# Patient Record
Sex: Male | Born: 1990 | Race: White | Hispanic: No | Marital: Single
Health system: Southern US, Community
[De-identification: ages and names within clinical notes are randomized; demographics above are authoritative.]

## PROBLEM LIST (undated history)

## (undated) DIAGNOSIS — R569 Unspecified convulsions: Secondary | ICD-10-CM

## (undated) HISTORY — DX: Unspecified convulsions: R56.9

## (undated) HISTORY — PX: VENTRICULO-PERITONEAL SHUNT PLACEMENT / LAPAROSCOPIC INSERTION PERITONEAL CATHETER: SUR1040

---
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
## 2009-12-14 ENCOUNTER — Emergency Department (HOSPITAL_COMMUNITY): Admission: EM | Admit: 2009-12-14 | Discharge: 2009-12-14 | Payer: Self-pay | Admitting: Emergency Medicine

## 2010-03-05 ENCOUNTER — Emergency Department (HOSPITAL_COMMUNITY): Admission: EM | Admit: 2010-03-05 | Discharge: 2010-03-05 | Payer: Self-pay | Admitting: Emergency Medicine

## 2011-02-20 LAB — HEMOGLOBIN A1C
Hgb A1c MFr Bld: 5.6 % (ref 4.6–6.1)
Mean Plasma Glucose: 114 mg/dL

## 2011-02-20 LAB — CBC
Hemoglobin: 13.8 g/dL (ref 13.0–17.0)
Platelets: 157 10*3/uL (ref 150–400)
RDW: 13 % (ref 11.5–15.5)
WBC: 14.8 10*3/uL — ABNORMAL HIGH (ref 4.0–10.5)

## 2011-02-20 LAB — COMPREHENSIVE METABOLIC PANEL
ALT: 18 U/L (ref 0–53)
AST: 23 U/L (ref 0–37)
BUN: 9 mg/dL (ref 6–23)
Chloride: 101 mEq/L (ref 96–112)
Creatinine, Ser: 0.62 mg/dL (ref 0.4–1.5)
Potassium: 3.8 mEq/L (ref 3.5–5.1)
Sodium: 139 mEq/L (ref 135–145)
Total Bilirubin: 0.2 mg/dL — ABNORMAL LOW (ref 0.3–1.2)

## 2011-02-20 LAB — LIPASE, BLOOD: Lipase: 27 U/L (ref 11–59)

## 2011-02-20 LAB — URINALYSIS, ROUTINE W REFLEX MICROSCOPIC
Nitrite: NEGATIVE
Specific Gravity, Urine: 1.014 (ref 1.005–1.030)

## 2011-02-20 LAB — DIFFERENTIAL
Basophils Absolute: 0.1 10*3/uL (ref 0.0–0.1)
Neutro Abs: 12 10*3/uL — ABNORMAL HIGH (ref 1.7–7.7)
Neutrophils Relative %: 81 % — ABNORMAL HIGH (ref 43–77)

## 2011-09-01 LAB — POCT I-STAT, CHEM 8
BUN: 10
Calcium, Ion: 1.14
Chloride: 103
Creatinine, Ser: 0.8
Glucose, Bld: 113 — ABNORMAL HIGH
HCT: 49
Hemoglobin: 16.7
Potassium: 3.8
Sodium: 140
TCO2: 29

## 2011-09-01 LAB — CBC
RBC: 5.2
WBC: 8.8

## 2011-09-01 LAB — DIFFERENTIAL
Lymphocytes Relative: 25
Monocytes Absolute: 0.6
Monocytes Relative: 7
Neutro Abs: 5.9

## 2011-09-01 LAB — VALPROIC ACID LEVEL: Valproic Acid Lvl: 64.6

## 2013-12-09 ENCOUNTER — Encounter: Payer: Self-pay | Admitting: Family

## 2013-12-09 DIAGNOSIS — R569 Unspecified convulsions: Secondary | ICD-10-CM

## 2013-12-09 DIAGNOSIS — Z87898 Personal history of other specified conditions: Secondary | ICD-10-CM | POA: Insufficient documentation

## 2013-12-09 DIAGNOSIS — F70 Mild intellectual disabilities: Secondary | ICD-10-CM

## 2013-12-09 DIAGNOSIS — G808 Other cerebral palsy: Secondary | ICD-10-CM

## 2013-12-09 DIAGNOSIS — Q039 Congenital hydrocephalus, unspecified: Secondary | ICD-10-CM | POA: Insufficient documentation

## 2013-12-09 DIAGNOSIS — F6381 Intermittent explosive disorder: Secondary | ICD-10-CM | POA: Insufficient documentation

## 2013-12-09 DIAGNOSIS — R51 Headache: Secondary | ICD-10-CM

## 2013-12-09 DIAGNOSIS — R519 Headache, unspecified: Secondary | ICD-10-CM | POA: Insufficient documentation

## 2013-12-12 ENCOUNTER — Ambulatory Visit: Payer: Self-pay | Admitting: Family

## 2013-12-24 ENCOUNTER — Encounter: Payer: Self-pay | Admitting: Family

## 2013-12-24 ENCOUNTER — Ambulatory Visit (INDEPENDENT_AMBULATORY_CARE_PROVIDER_SITE_OTHER): Payer: Medicaid Other | Admitting: Family

## 2013-12-24 VITALS — BP 112/78 | HR 84 | Ht 70.0 in | Wt 199.8 lb

## 2013-12-24 DIAGNOSIS — G808 Other cerebral palsy: Secondary | ICD-10-CM

## 2013-12-24 DIAGNOSIS — Q039 Congenital hydrocephalus, unspecified: Secondary | ICD-10-CM

## 2013-12-24 DIAGNOSIS — F70 Mild intellectual disabilities: Secondary | ICD-10-CM

## 2013-12-24 DIAGNOSIS — R51 Headache: Secondary | ICD-10-CM

## 2013-12-24 DIAGNOSIS — R259 Unspecified abnormal involuntary movements: Secondary | ICD-10-CM

## 2013-12-24 DIAGNOSIS — R569 Unspecified convulsions: Secondary | ICD-10-CM

## 2013-12-24 DIAGNOSIS — R251 Tremor, unspecified: Secondary | ICD-10-CM

## 2013-12-24 DIAGNOSIS — F6381 Intermittent explosive disorder: Secondary | ICD-10-CM
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
## 2013-12-25 ENCOUNTER — Encounter: Payer: Self-pay | Admitting: Family

## 2013-12-25 DIAGNOSIS — R251 Tremor, unspecified: Secondary | ICD-10-CM | POA: Insufficient documentation

## 2013-12-25 NOTE — Patient Instructions (Signed)
Continue Eric Wyatt's medications without change.  Let me know if you have any questions or concerns.  Please plan to follow up in 1 year or sooner if needed.

## 2014-12-24 ENCOUNTER — Encounter: Payer: Self-pay | Admitting: Family

## 2014-12-24 ENCOUNTER — Ambulatory Visit (INDEPENDENT_AMBULATORY_CARE_PROVIDER_SITE_OTHER): Payer: Medicaid Other | Admitting: Family

## 2014-12-24 VITALS — BP 114/72 | HR 80 | Ht 70.0 in | Wt 205.2 lb

## 2014-12-24 DIAGNOSIS — Q039 Congenital hydrocephalus, unspecified: Secondary | ICD-10-CM

## 2014-12-24 DIAGNOSIS — G801 Spastic diplegic cerebral palsy: Secondary | ICD-10-CM

## 2014-12-24 DIAGNOSIS — F70 Mild intellectual disabilities: Secondary | ICD-10-CM

## 2014-12-24 DIAGNOSIS — G808 Other cerebral palsy: Secondary | ICD-10-CM

## 2014-12-24 DIAGNOSIS — F6381 Intermittent explosive disorder: Secondary | ICD-10-CM

## 2014-12-24 DIAGNOSIS — R251 Tremor, unspecified: Secondary | ICD-10-CM
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
## 2014-12-25 NOTE — Patient Instructions (Signed)
Continue Heru's medications as recommended by his psychiatrist. Contact this office if Onalee HuaDavid has breakthrough seizures.   Please plan to return for follow up in 1 year or sooner if needed.

## 2016-01-20 ENCOUNTER — Ambulatory Visit (INDEPENDENT_AMBULATORY_CARE_PROVIDER_SITE_OTHER): Payer: Medicaid Other | Admitting: Family

## 2016-01-20 ENCOUNTER — Encounter: Payer: Self-pay | Admitting: Family

## 2016-01-20 VITALS — BP 110/76 | HR 84 | Ht 70.0 in | Wt 205.0 lb

## 2016-01-20 DIAGNOSIS — Z8669 Personal history of other diseases of the nervous system and sense organs: Secondary | ICD-10-CM

## 2016-01-20 DIAGNOSIS — G808 Other cerebral palsy: Secondary | ICD-10-CM

## 2016-01-20 DIAGNOSIS — F6381 Intermittent explosive disorder: Secondary | ICD-10-CM | POA: Diagnosis not present

## 2016-01-20 DIAGNOSIS — F70 Mild intellectual disabilities: Secondary | ICD-10-CM | POA: Diagnosis not present

## 2016-01-20 DIAGNOSIS — Z87898 Personal history of other specified conditions: Secondary | ICD-10-CM

## 2016-01-20 DIAGNOSIS — Q039 Congenital hydrocephalus, unspecified: Secondary | ICD-10-CM | POA: Diagnosis not present

## 2016-01-20 DIAGNOSIS — R251 Tremor, unspecified: Secondary | ICD-10-CM

## 2016-01-20 DIAGNOSIS — G801 Spastic diplegic cerebral palsy: Secondary | ICD-10-CM | POA: Diagnosis not present
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
## 2016-01-21 NOTE — Patient Instructions (Signed)
Continue giving Eric Wyatt's medications has you have been giving them. Let me know if you have any concerns.  Please plan to return for follow up in 1 year or sooner if needed.

## 2016-07-01 DIAGNOSIS — Z0279 Encounter for issue of other medical certificate: Secondary | ICD-10-CM

## 2017-12-07 ENCOUNTER — Encounter (INDEPENDENT_AMBULATORY_CARE_PROVIDER_SITE_OTHER): Payer: Self-pay | Admitting: *Deleted

## 2017-12-14 ENCOUNTER — Ambulatory Visit (INDEPENDENT_AMBULATORY_CARE_PROVIDER_SITE_OTHER): Payer: Medicaid Other | Admitting: Family

## 2017-12-14 ENCOUNTER — Encounter (INDEPENDENT_AMBULATORY_CARE_PROVIDER_SITE_OTHER): Payer: Self-pay | Admitting: Family

## 2017-12-14 VITALS — BP 110/60 | HR 84 | Ht 70.0 in | Wt 214.2 lb

## 2017-12-14 DIAGNOSIS — Q039 Congenital hydrocephalus, unspecified: Secondary | ICD-10-CM | POA: Diagnosis not present

## 2017-12-14 DIAGNOSIS — F6381 Intermittent explosive disorder: Secondary | ICD-10-CM | POA: Diagnosis not present

## 2017-12-14 DIAGNOSIS — Z87898 Personal history of other specified conditions: Secondary | ICD-10-CM | POA: Diagnosis not present

## 2017-12-14 DIAGNOSIS — G808 Other cerebral palsy: Secondary | ICD-10-CM | POA: Diagnosis not present

## 2017-12-14 DIAGNOSIS — R251 Tremor, unspecified: Secondary | ICD-10-CM | POA: Diagnosis not present

## 2017-12-14 DIAGNOSIS — F70 Mild intellectual disabilities: Secondary | ICD-10-CM | POA: Diagnosis not present
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
## 2017-12-18 ENCOUNTER — Encounter (INDEPENDENT_AMBULATORY_CARE_PROVIDER_SITE_OTHER): Payer: Self-pay | Admitting: Family

## 2017-12-18 NOTE — Patient Instructions (Signed)
Thank you for coming in today.   Instructions for you until your next appointment are as follows: 1.   Continue Branson's medications as you have been giving it.  2.  Let me know if he has any seizures or if you have any other concerns.  3.  Please plan to return for follow up in one year or sooner if needed.

## 2019-06-12 ENCOUNTER — Encounter (HOSPITAL_COMMUNITY): Payer: Self-pay

## 2019-06-12 ENCOUNTER — Other Ambulatory Visit: Payer: Self-pay

## 2019-06-12 ENCOUNTER — Ambulatory Visit (HOSPITAL_COMMUNITY)
Admission: EM | Admit: 2019-06-12 | Discharge: 2019-06-12 | Disposition: A | Discharge status: Home / Self Care | Payer: Medicaid Other | Attending: Urgent Care | Admitting: Urgent Care

## 2019-06-12 ENCOUNTER — Ambulatory Visit (INDEPENDENT_AMBULATORY_CARE_PROVIDER_SITE_OTHER): Payer: Medicaid Other

## 2019-06-12 DIAGNOSIS — M25512 Pain in left shoulder: Secondary | ICD-10-CM

## 2019-06-12 MED ORDER — NAPROXEN 500 MG PO TABS: 500.0000 mg | ORAL_TABLET | Freq: Two times a day (BID) | ORAL | 0 refills | Status: DC

## 2019-06-12 NOTE — ED Triage Notes (Signed)
Pt states he went swimming yesterday. Pt has swelling in his left shoulder. Pt states he felt something pop. Pt states he slipped in the pool. Pt came in with his caregiver.

## 2019-06-12 NOTE — ED Provider Notes (Signed)
MRN: [id number] DOB: 12/02/1991  Subjective:   Eric Wyatt is a 28 y.o. male presenting for 4-day history of gradually progressing and persistent intermittent left shoulder pain that is moderate in severity.  Patient reports that he notices sharp pain mostly in the mornings.  States that he felt like it popped when he was swimming yesterday morning.  Has been using Tylenol for relief.  Denies trauma, fever, redness, warmth, bony deformity, neck pain, radiation of pain.  No current facility-administered medications for this encounter.   Current Outpatient Medications:  .  divalproex (DEPAKOTE ER) 500 MG 24 hr tablet, Take 1 tablet twice per day, Disp: , Rfl:  .  docusate sodium (COLACE) 100 MG capsule, Take 1 capsule twice per day, Disp: , Rfl:  .  Multiple Vitamin (MULTIVITAMIN) tablet, Take 1 tablet by mouth daily. Reported on 01/20/2016, Disp: , Rfl:  .  propranolol (INDERAL) 10 MG tablet, Take 1 tablet twice per day, Disp: , Rfl:  .  risperiDONE (RISPERDAL) 3 MG tablet, Take by mouth. Take 1 tablet twice per day, Disp: , Rfl:    No Known Allergies  Past Medical History:  Diagnosis Date  . Seizures ([place])      Past Surgical History:  Procedure Laterality Date  . SHUNT REVISION  06/17/2008  . VENTRICULO-PERITONEAL SHUNT PLACEMENT / LAPAROSCOPIC INSERTION PERITONEAL CATHETER      ROS  Objective:   Vitals: BP 122/78 (BP Location: Right Arm)   Pulse 86   Temp 98.6 F (37 C) (Oral)   Resp 18   Wt 220 lb (99.8 kg)   SpO2 98%   BMI 31.57 kg/m   Physical Exam Constitutional:      Appearance: Normal appearance. He is well-developed and normal weight.  HENT:     Head: Normocephalic and atraumatic.     Right Ear: External ear normal.     Left Ear: External ear normal.     Nose: Nose normal.     Mouth/Throat:     Pharynx: Oropharynx is clear.  Eyes:     Extraocular Movements: Extraocular movements intact.     Pupils: Pupils are equal, round, and reactive to light.   Cardiovascular:     Rate and Rhythm: Normal rate.  Pulmonary:     Effort: Pulmonary effort is normal.  Musculoskeletal:     Left shoulder: He exhibits decreased range of motion, tenderness (Over area depicted) and decreased strength (Secondary to pain). He exhibits no bony tenderness, no swelling, no effusion, no crepitus, no deformity and no laceration.       Arms:  Neurological:     Mental Status: He is alert and oriented to person, place, and time.  Psychiatric:        Mood and Affect: Mood normal.        Behavior: Behavior normal.    Dg Shoulder Left  Result Date: 06/12/2019 CLINICAL DATA:  Left shoulder pain since an injury swimming yesterday. Initial encounter. EXAM: LEFT SHOULDER - 2+ VIEW COMPARISON:  None. FINDINGS: There is no evidence of fracture or dislocation. There is no evidence of arthropathy or other focal bone abnormality. Soft tissues are unremarkable. IMPRESSION: Normal exam. Electronically Signed   By: Inge Rise M.D.   On: 06/12/2019 11:28    Assessment and Plan :    1. Acute pain of left shoulder    Suspect musculoskeletal pain related to overuse.  Recommended that he stop using the shoulder sling and do light shoulder rehab, use naproxen for pain and  inflammation. Counseled patient on potential for adverse effects with medications prescribed/recommended today, ER and return-to-clinic precautions discussed, patient verbalized understanding.    Wallis BambergMani, Porche Steinberger, [place] JerseyPA-C 06/12/19 1138

## 2019-10-01 ENCOUNTER — Other Ambulatory Visit: Payer: Self-pay

## 2019-10-01 DIAGNOSIS — Z20822 Contact with and (suspected) exposure to covid-19: Secondary | ICD-10-CM

## 2019-10-02 LAB — NOVEL CORONAVIRUS, NAA: SARS-CoV-2, NAA: NOT DETECTED

## 2019-11-04 ENCOUNTER — Other Ambulatory Visit: Payer: Self-pay

## 2019-11-04 DIAGNOSIS — Z20822 Contact with and (suspected) exposure to covid-19: Secondary | ICD-10-CM

## 2019-11-05 LAB — NOVEL CORONAVIRUS, NAA: SARS-CoV-2, NAA: NOT DETECTED

## 2019-12-23 ENCOUNTER — Encounter (INDEPENDENT_AMBULATORY_CARE_PROVIDER_SITE_OTHER): Payer: Self-pay | Admitting: Family

## 2019-12-23 ENCOUNTER — Other Ambulatory Visit: Payer: Self-pay

## 2019-12-23 ENCOUNTER — Ambulatory Visit (INDEPENDENT_AMBULATORY_CARE_PROVIDER_SITE_OTHER): Payer: Medicaid Other | Admitting: Family

## 2019-12-23 VITALS — Wt 240.0 lb

## 2019-12-23 DIAGNOSIS — F6381 Intermittent explosive disorder: Secondary | ICD-10-CM | POA: Diagnosis not present

## 2019-12-23 DIAGNOSIS — Q039 Congenital hydrocephalus, unspecified: Secondary | ICD-10-CM

## 2019-12-23 DIAGNOSIS — G808 Other cerebral palsy: Secondary | ICD-10-CM | POA: Diagnosis not present

## 2019-12-23 DIAGNOSIS — F70 Mild intellectual disabilities: Secondary | ICD-10-CM

## 2019-12-23 DIAGNOSIS — R251 Tremor, unspecified: Secondary | ICD-10-CM

## 2019-12-23 DIAGNOSIS — Z87898 Personal history of other specified conditions: Secondary | ICD-10-CM
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
## 2019-12-28 ENCOUNTER — Encounter (INDEPENDENT_AMBULATORY_CARE_PROVIDER_SITE_OTHER): Payer: Self-pay | Admitting: Family

## 2019-12-28 NOTE — Patient Instructions (Signed)
Thank you for meeting with me by Webex today.   Instructions for you until your next appointment are as follows: 1. Continue taking your medications as you have been doing.  2. Let me know if you have any seizures.  3. Please sign up for MyChart if you have not done so 4. Please plan to return for follow up in one year or sooner if needed.

## 2020-05-01 ENCOUNTER — Encounter (HOSPITAL_COMMUNITY): Payer: Self-pay

## 2020-05-01 ENCOUNTER — Ambulatory Visit (HOSPITAL_COMMUNITY)
Admission: EM | Admit: 2020-05-01 | Discharge: 2020-05-01 | Disposition: A | Discharge status: Home / Self Care | Payer: Medicaid Other

## 2020-05-01 DIAGNOSIS — T148XXA Other injury of unspecified body region, initial encounter: Secondary | ICD-10-CM | POA: Diagnosis not present
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
## 2020-05-01 NOTE — Discharge Instructions (Signed)
You have some bruising to your body  Continue to watch these areas and follow up as needed.
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
## 2020-11-17 ENCOUNTER — Encounter (INDEPENDENT_AMBULATORY_CARE_PROVIDER_SITE_OTHER): Payer: Self-pay | Admitting: Family

## 2020-11-17 ENCOUNTER — Ambulatory Visit (INDEPENDENT_AMBULATORY_CARE_PROVIDER_SITE_OTHER): Payer: Medicaid Other | Admitting: Family

## 2020-11-17 ENCOUNTER — Other Ambulatory Visit: Payer: Self-pay

## 2020-11-17 VITALS — BP 118/78 | HR 108 | Ht 70.0 in | Wt 236.0 lb

## 2020-11-17 DIAGNOSIS — R251 Tremor, unspecified: Secondary | ICD-10-CM

## 2020-11-17 DIAGNOSIS — G808 Other cerebral palsy: Secondary | ICD-10-CM | POA: Diagnosis not present

## 2020-11-17 DIAGNOSIS — F70 Mild intellectual disabilities: Secondary | ICD-10-CM

## 2020-11-17 DIAGNOSIS — F6381 Intermittent explosive disorder: Secondary | ICD-10-CM

## 2020-11-17 DIAGNOSIS — Q039 Congenital hydrocephalus, unspecified: Secondary | ICD-10-CM | POA: Diagnosis not present

## 2020-11-17 DIAGNOSIS — Z87898 Personal history of other specified conditions: Secondary | ICD-10-CM | POA: Diagnosis not present

## 2020-11-17 MED ORDER — PROPRANOLOL HCL 10 MG PO TABS
ORAL_TABLET | ORAL | 5 refills | Status: DC
Start: 2020-11-17 — End: 2021-03-03

## 2020-11-17 MED ORDER — DIVALPROEX SODIUM ER 250 MG PO TB24: ORAL_TABLET | ORAL | 5 refills | Status: AC

## 2020-11-17 NOTE — Patient Instructions (Signed)
Thank you for coming in today.   Instructions for you until your next appointment are as follows: 1. Increase the Propranolol 10mg  to 1 tablet in the morning and 2 tablets at night 2. Continue your other medications as prescribed 3. Work on reducing calories and being more active. I am concerned about your weight as being overweight can lead to disorders such as diabetes, high blood pressure and high cholesterol. I have printed the last 3 weights that we have recorded for you.   Wt Readings from Last 3 Encounters:  11/17/20 236 lb (107 kg)  12/23/19 240 lb (108.9 kg)  06/12/19 220 lb (99.8 kg)   4. Please plan to return for follow up in one year or sooner if needed.
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
## 2020-11-20 ENCOUNTER — Encounter (INDEPENDENT_AMBULATORY_CARE_PROVIDER_SITE_OTHER): Payer: Self-pay | Admitting: Family

## 2021-02-03 ENCOUNTER — Telehealth (INDEPENDENT_AMBULATORY_CARE_PROVIDER_SITE_OTHER): Payer: Self-pay | Admitting: Family

## 2021-02-22 ENCOUNTER — Ambulatory Visit (INDEPENDENT_AMBULATORY_CARE_PROVIDER_SITE_OTHER): Payer: Medicaid Other | Admitting: Family

## 2021-02-22 ENCOUNTER — Ambulatory Visit
Admission: RE | Admit: 2021-02-22 | Discharge: 2021-02-22 | Disposition: A | Discharge status: Home / Self Care | Payer: Medicaid Other | Source: Ambulatory Visit | Attending: Family | Admitting: Family

## 2021-02-22 ENCOUNTER — Other Ambulatory Visit: Payer: Self-pay | Admitting: Family

## 2021-02-22 ENCOUNTER — Encounter (INDEPENDENT_AMBULATORY_CARE_PROVIDER_SITE_OTHER): Payer: Self-pay | Admitting: Family

## 2021-02-22 ENCOUNTER — Other Ambulatory Visit: Payer: Self-pay

## 2021-02-22 VITALS — BP 120/90 | HR 76 | Ht 70.0 in | Wt 236.8 lb

## 2021-02-22 DIAGNOSIS — Q039 Congenital hydrocephalus, unspecified: Secondary | ICD-10-CM | POA: Diagnosis not present

## 2021-02-22 DIAGNOSIS — R251 Tremor, unspecified: Secondary | ICD-10-CM | POA: Diagnosis not present

## 2021-02-22 DIAGNOSIS — Z982 Presence of cerebrospinal fluid drainage device: Secondary | ICD-10-CM | POA: Diagnosis not present

## 2021-02-22 DIAGNOSIS — Z87898 Personal history of other specified conditions: Secondary | ICD-10-CM

## 2021-02-22 DIAGNOSIS — F6381 Intermittent explosive disorder: Secondary | ICD-10-CM

## 2021-02-22 DIAGNOSIS — G808 Other cerebral palsy: Secondary | ICD-10-CM

## 2021-02-22 DIAGNOSIS — F70 Mild intellectual disabilities: Secondary | ICD-10-CM

## 2021-02-22 NOTE — Patient Instructions (Signed)
Thank you for coming in today.   Instructions for you until your next appointment are as follows: 1. I have ordered x-rays to check the shunt. The imaging facility is located in the ground floor of the [facility] at [address]. You do not need an appointment for the x-rays. Try to get there between 8AM and 4PM. I will call you when I receive the results 2. Continue Anden's medications as prescribed 3. Be sure to keep the psychiatrist appointment this week.   At [facility], we are committed to providing exceptional care. You will receive a patient satisfaction survey through text or email regarding your visit today. Your opinion is important to me. Comments are appreciated.
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
## 2021-03-02 ENCOUNTER — Other Ambulatory Visit: Payer: Self-pay

## 2021-03-02 ENCOUNTER — Ambulatory Visit (HOSPITAL_COMMUNITY)
Admission: EM | Admit: 2021-03-02 | Discharge: 2021-03-02 | Disposition: A | Discharge status: Home / Self Care | Payer: Medicaid Other | Attending: Psychiatry | Admitting: Psychiatry

## 2021-03-02 DIAGNOSIS — R4689 Other symptoms and signs involving appearance and behavior: Secondary | ICD-10-CM

## 2021-03-02 DIAGNOSIS — F70 Mild intellectual disabilities: Secondary | ICD-10-CM | POA: Insufficient documentation

## 2021-03-02 DIAGNOSIS — R456 Violent behavior: Secondary | ICD-10-CM | POA: Insufficient documentation
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
## 2021-03-02 NOTE — BH Assessment (Signed)
Notified guardian [place] [phone]) of that patient treatment and disposition at [facility]. Guardian reports looking for placement for pt and reports that she has been pt guardian for 7 years and this behavior is new for pt. Guardian reports seeing pt last week for a doctors appointment. [facility] worker Oswaldo Milian notified that patient is psychiatrically cleared and ready for pick up.
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
## 2021-03-02 NOTE — Discharge Instructions (Signed)

## 2021-03-03 ENCOUNTER — Encounter (INDEPENDENT_AMBULATORY_CARE_PROVIDER_SITE_OTHER): Payer: Self-pay | Admitting: Family

## 2021-03-03 MED ORDER — PROPRANOLOL HCL 10 MG PO TABS: ORAL_TABLET | ORAL | 5 refills | Status: AC

## 2021-04-05 ENCOUNTER — Encounter (HOSPITAL_COMMUNITY): Payer: Self-pay

## 2021-04-05 ENCOUNTER — Emergency Department (HOSPITAL_COMMUNITY)
Admission: EM | Admit: 2021-04-05 | Discharge: 2021-04-07 | Disposition: A | Discharge status: Home / Self Care | Payer: Medicaid Other | Attending: Emergency Medicine | Admitting: Emergency Medicine

## 2021-04-05 DIAGNOSIS — R456 Violent behavior: Secondary | ICD-10-CM | POA: Diagnosis present

## 2021-04-05 DIAGNOSIS — R4182 Altered mental status, unspecified: Secondary | ICD-10-CM | POA: Insufficient documentation

## 2021-04-05 DIAGNOSIS — Z7722 Contact with and (suspected) exposure to environmental tobacco smoke (acute) (chronic): Secondary | ICD-10-CM | POA: Diagnosis not present

## 2021-04-05 DIAGNOSIS — Z20822 Contact with and (suspected) exposure to covid-19: Secondary | ICD-10-CM | POA: Insufficient documentation

## 2021-04-05 DIAGNOSIS — R4689 Other symptoms and signs involving appearance and behavior: Secondary | ICD-10-CM

## 2021-04-05 LAB — COMPREHENSIVE METABOLIC PANEL
ALT: 18 U/L (ref 0–44)
AST: 17 U/L (ref 15–41)
Albumin: 4.1 g/dL (ref 3.5–5.0)
Alkaline Phosphatase: 41 U/L (ref 38–126)
Anion gap: 10 (ref 5–15)
BUN: 12 mg/dL (ref 6–20)
CO2: 25 mmol/L (ref 22–32)
Calcium: 9.2 mg/dL (ref 8.9–10.3)
Chloride: 104 mmol/L (ref 98–111)
Creatinine, Ser: 0.79 mg/dL (ref 0.61–1.24)
GFR, Estimated: >60 mL/min (ref >60)
Glucose, Bld: 105 mg/dL — ABNORMAL HIGH (ref 70–99)
Potassium: 3.7 mmol/L (ref 3.5–5.1)
Sodium: 139 mmol/L (ref 135–145)
Total Bilirubin: 0.4 mg/dL (ref 0.3–1.2)
Total Protein: 7.5 g/dL (ref 6.5–8.1)

## 2021-04-05 LAB — CBC WITH DIFFERENTIAL/PLATELET
Abs Immature Granulocytes: 0.03 10*3/uL (ref 0.00–0.07)
Basophils Absolute: 0 10*3/uL (ref 0.0–0.1)
Basophils Relative: 0 %
Eosinophils Absolute: 0.1 10*3/uL (ref 0.0–0.5)
Eosinophils Relative: 2 %
HCT: 45.2 % (ref 39.0–52.0)
Hemoglobin: 15 g/dL (ref 13.0–17.0)
Immature Granulocytes: 1 %
Lymphocytes Relative: 32 %
Lymphs Abs: 1.9 10*3/uL (ref 0.7–4.0)
MCH: 30.5 pg (ref 26.0–34.0)
MCHC: 33.2 g/dL (ref 30.0–36.0)
MCV: 92.1 fL (ref 80.0–100.0)
Monocytes Absolute: 0.5 10*3/uL (ref 0.1–1.0)
Monocytes Relative: 8 %
Neutro Abs: 3.4 10*3/uL (ref 1.7–7.7)
Neutrophils Relative %: 57 %
Platelets: 188 10*3/uL (ref 150–400)
RBC: 4.91 MIL/uL (ref 4.22–5.81)
RDW: 13.4 % (ref 11.5–15.5)
WBC: 6 10*3/uL (ref 4.0–10.5)
nRBC: 0 % (ref 0.0–0.2)

## 2021-04-05 LAB — RESP PANEL BY RT-PCR (FLU A&B, COVID) ARPGX2
Influenza A by PCR: NEGATIVE
Influenza B by PCR: NEGATIVE
SARS Coronavirus 2 by RT PCR: NEGATIVE

## 2021-04-05 LAB — RAPID URINE DRUG SCREEN, HOSP PERFORMED
Amphetamines: NOT DETECTED
Barbiturates: NOT DETECTED
Benzodiazepines: NOT DETECTED
Cocaine: NOT DETECTED
Opiates: NOT DETECTED
Tetrahydrocannabinol: NOT DETECTED

## 2021-04-05 LAB — ETHANOL: Alcohol, Ethyl (B): <10 mg/dL (ref <10)
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
## 2021-04-05 NOTE — BH Assessment (Signed)
TTS attempted to see per Nurse cart being used by provider will secure chat when cart is available.
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
## 2021-04-06 LAB — VALPROIC ACID LEVEL: Valproic Acid Lvl: 43 ug/mL — ABNORMAL LOW (ref 50.0–100.0)

## 2021-04-06 MED ORDER — CLONAZEPAM 1 MG PO TABS
1.0000 mg | ORAL_TABLET | Freq: Every evening | ORAL | Status: DC
Start: 2021-04-06 — End: 2021-04-07
  Administered 2021-04-06: 1 mg via ORAL
  Filled 2021-04-06: qty 1

## 2021-04-06 MED ORDER — DIVALPROEX SODIUM ER 500 MG PO TB24
750.0000 mg | ORAL_TABLET | Freq: Two times a day (BID) | ORAL | Status: DC
  Administered 2021-04-06 – 2021-04-07 (×2): 750 mg via ORAL
  Filled 2021-04-06 (×2): qty 1

## 2021-04-06 MED ORDER — PROPRANOLOL HCL 20 MG PO TABS
10.0000 mg | ORAL_TABLET | Freq: Two times a day (BID) | ORAL | Status: DC
Start: 2021-04-06 — End: 2021-04-07
  Administered 2021-04-06 – 2021-04-07 (×2): 10 mg via ORAL
  Filled 2021-04-06 (×2): qty 1

## 2021-04-06 MED ORDER — RISPERIDONE 2 MG PO TABS
3.0000 mg | ORAL_TABLET | Freq: Two times a day (BID) | ORAL | Status: DC
  Administered 2021-04-06 – 2021-04-07 (×2): 3 mg via ORAL
  Filled 2021-04-06 (×2): qty 1

## 2021-04-06 NOTE — ED Provider Notes (Signed)
Emergency Medicine Observation Re-evaluation Note  Eric Wyatt is a 30 y.o. male, seen on rounds today.  Pt initially presented to the ED for complaints of Aggressive Behavior Currently, the patient is resting quietly in bed.  Physical Exam  BP 131/72 (BP Location: Right Arm)   Pulse 85   Temp 98.4 F (36.9 C) (Axillary)   Resp 15   SpO2 94%  Physical Exam General: nad Lungs: no distress Psych: calm  ED Course / MDM  EKG:   I have reviewed the labs performed to date as well as medications administered while in observation.  Recent changes in the last 24 hours include none.  Plan  Current plan is for behavioral health assessment. Patient is not under full IVC at this time.   Koleen Distance, MD 04/06/21 [phone]
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
## 2021-04-06 NOTE — BH Assessment (Signed)
5/3 Per [name] , NP patient is psych cleared.

## 2021-04-06 NOTE — Discharge Instructions (Signed)
For your behavioral health needs you are advised to continue treatment with your regular outpatient provider. 

## 2021-04-07 NOTE — ED Provider Notes (Signed)
Emergency Medicine Observation Re-evaluation Note  Eric Wyatt is a 30 y.o. male, seen on rounds today.  Pt initially presented to the ED for complaints of Aggressive Behavior Currently, the patient is resting.   Physical Exam  BP 117/72 (BP Location: Left Arm)   Pulse 63   Temp 97.6 F (36.4 C) (Oral)   Resp 17   SpO2 97%  Physical Exam General: calm, resting in bed Cardiac: warm and well perfused Lungs: even and unlabored Psych: calm  ED Course / MDM  EKG:   I have reviewed the labs performed to date as well as medications administered while in observation.  Recent changes in the last 24 hours include   Psych cleared pt. IVC rescinded. Facility completing repairs prior to accepting patient back.  Plan  Current plan is for discharge back to facility  Appreciate TOC assistance in discharge.    Milagros Loll, MD 04/07/21 [phone]

## 2021-04-07 NOTE — BH Assessment (Addendum)
BHH Assessment Progress Note  At 10:03 this writer called Charleston Poot [phone]) with pt's [facility] network to follow up on plans for pt to be discharged.  He reports that he is checking to make sure that all necessary repairs to pt's facility have been completed, after which he will make arrangements with [facility] staff to pick pt up.  He has my phone number and agrees to call me back with an update later today.  Doylene Canning, [place] Behavioral Health Coordinator [phone]   Addendum:  At 11:59 Apolinar Junes called back and left a message for me.  Facility is now ready for pt to return.  His care provider, Zena Amos, will present at [facility] this afternoon, probably around 15:30 to pick him up.  Dr Stevie Kern and pt's nurses, Dawn and [place], have been notified.  Doylene Canning, [place] Behavioral Health Coordinator [phone]

## 2021-04-09 NOTE — Telephone Encounter (Signed)
A user error has taken place: encounter opened in error, closed for administrative reasons.

## 2021-04-11 ENCOUNTER — Encounter (INDEPENDENT_AMBULATORY_CARE_PROVIDER_SITE_OTHER): Payer: Self-pay

## 2021-09-28 IMAGING — CR DG CERVICAL SPINE 1V
1 series · 1 of 1 positions shown · non-contrast
Comparison: None.

CLINICAL DATA: Evaluate for possible shunt malfunction.

EXAM:
ABDOMEN - 1 VIEW; SKULL - 1-3 VIEW; CHEST 1 VIEW; DG CERVICAL SPINE
- 1 VIEW

[w c-spine a.p. *]
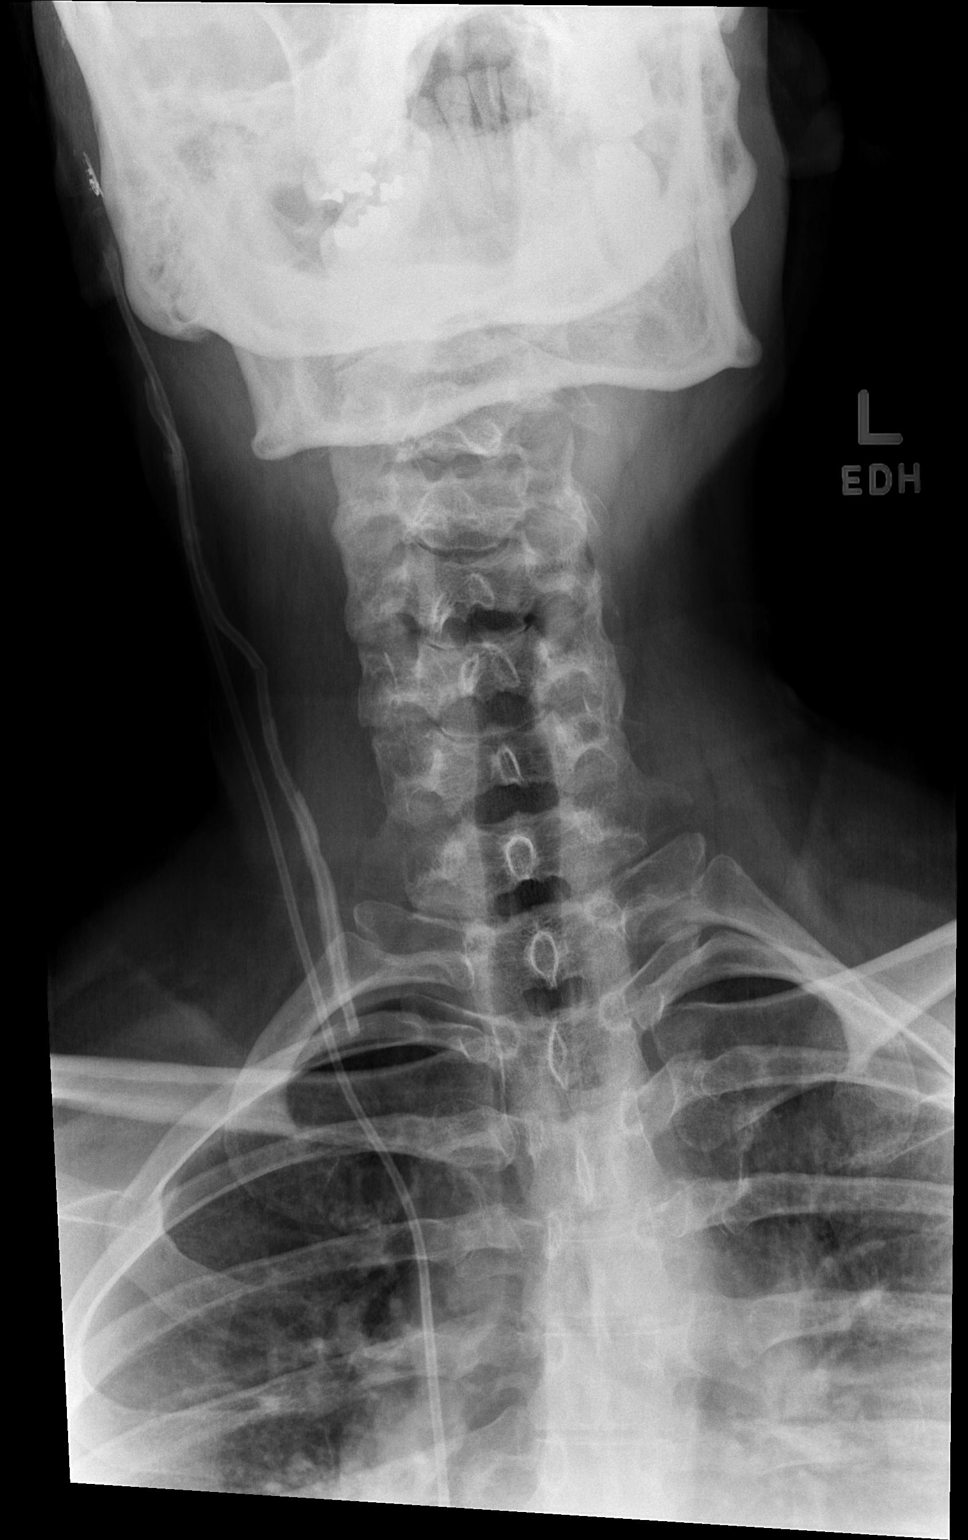

[1 of 1 positions shown; findings below may reference images not displayed]

FINDINGS: The intracranial portion of the shunt appears to be in good
position. I do not see any complicating features. There are
discontinuous areas but that is typical. Dural calcifications are
noted.

There is an old disconnected catheter in the right neck area a some
surrounding dystrophic calcification. The new catheter appears
patent into the chest.

The catheter is patent coursing down the chest into the abdomen. No
complicating features are identified. The lungs are clear. The
abdominal bowel gas pattern is unremarkable. The bony structures are
unremarkable.
IMPRESSION: No complicating features associated with the ventriculoperitoneal
shunt catheter.

## 2021-09-28 IMAGING — CR DG ABDOMEN 1V
2 series · 2 of 2 positions shown · non-contrast
Comparison: None.

CLINICAL DATA: Evaluate for possible shunt malfunction.

EXAM:
ABDOMEN - 1 VIEW; SKULL - 1-3 VIEW; CHEST 1 VIEW; DG CERVICAL SPINE
- 1 VIEW

[w abdomen upright *]
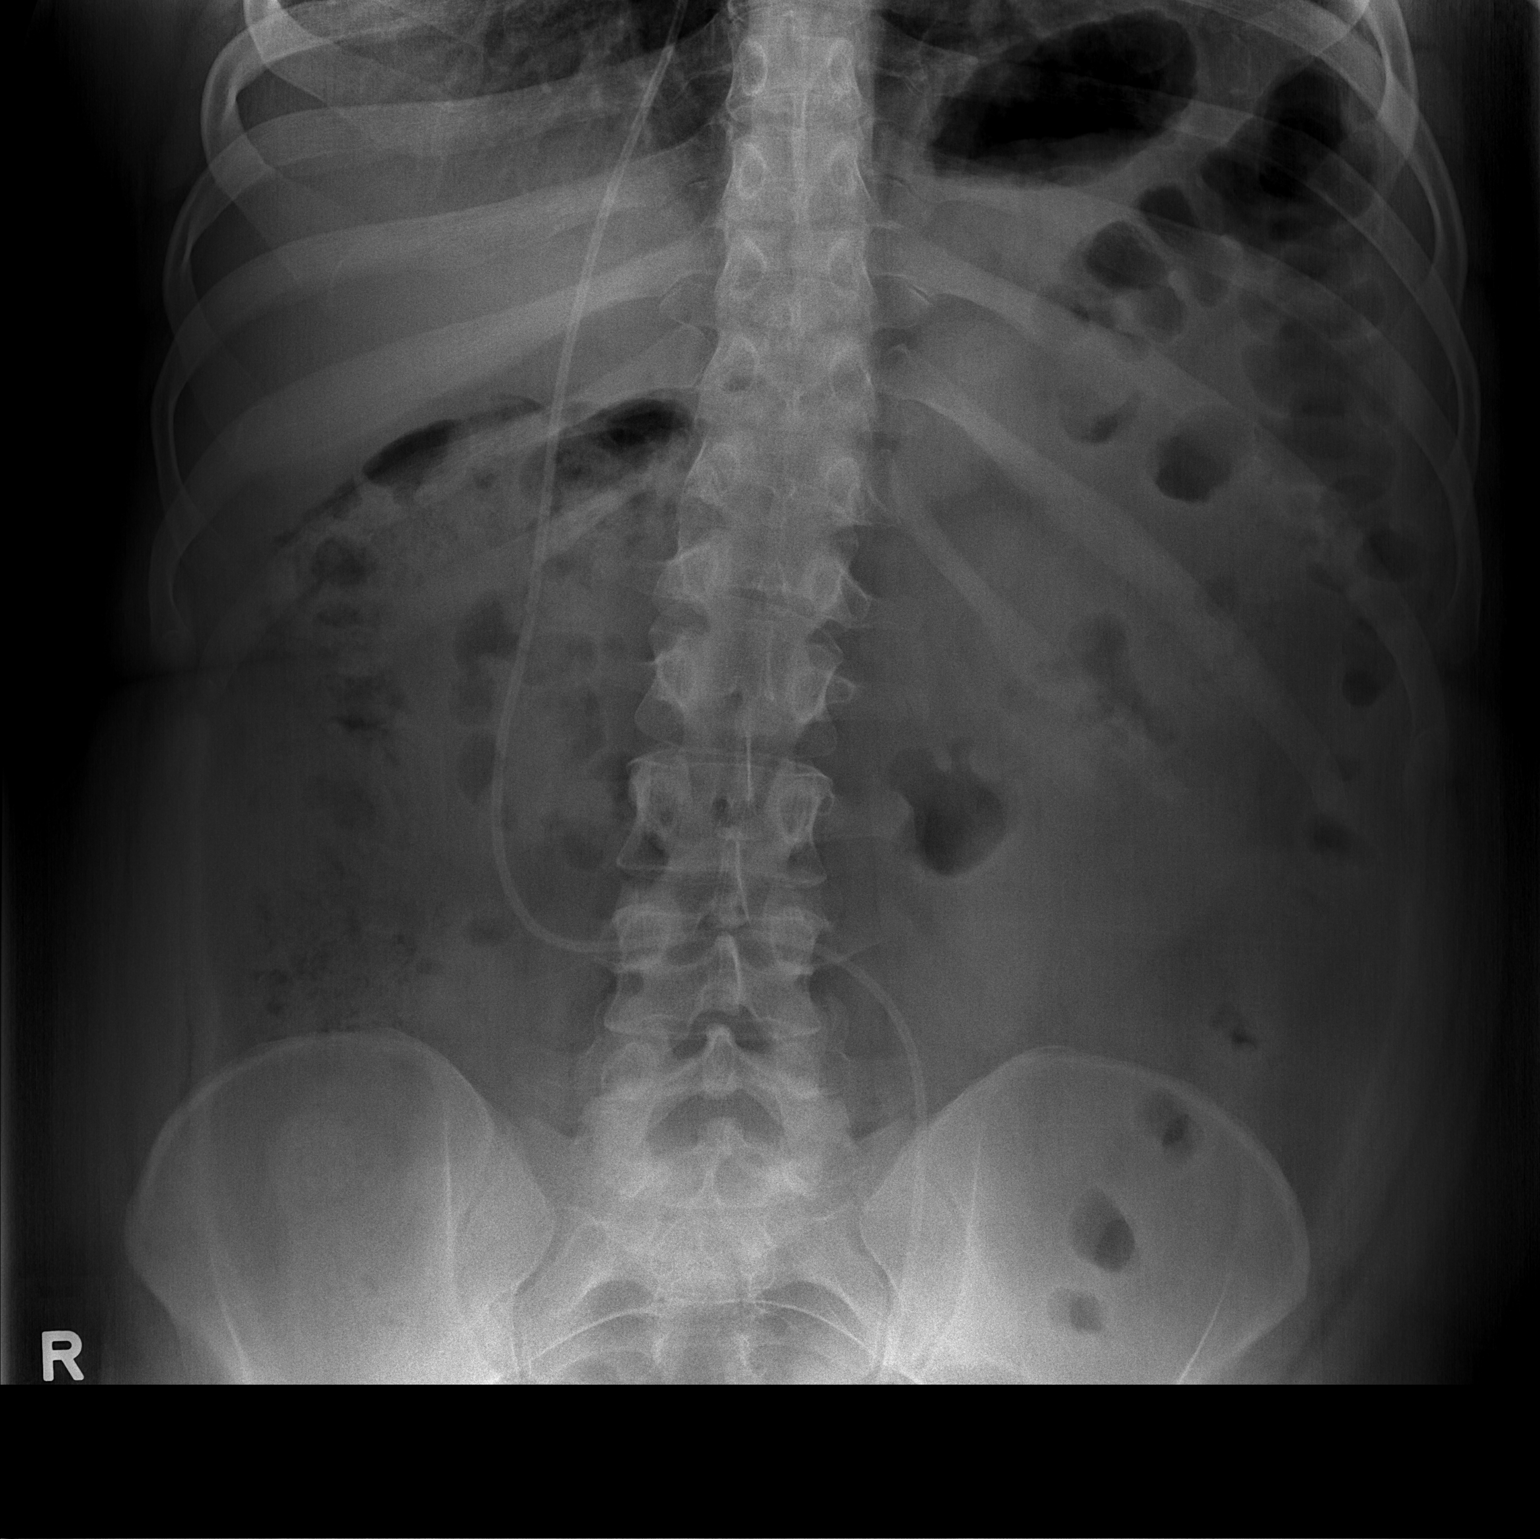

[w abdomen decub *]
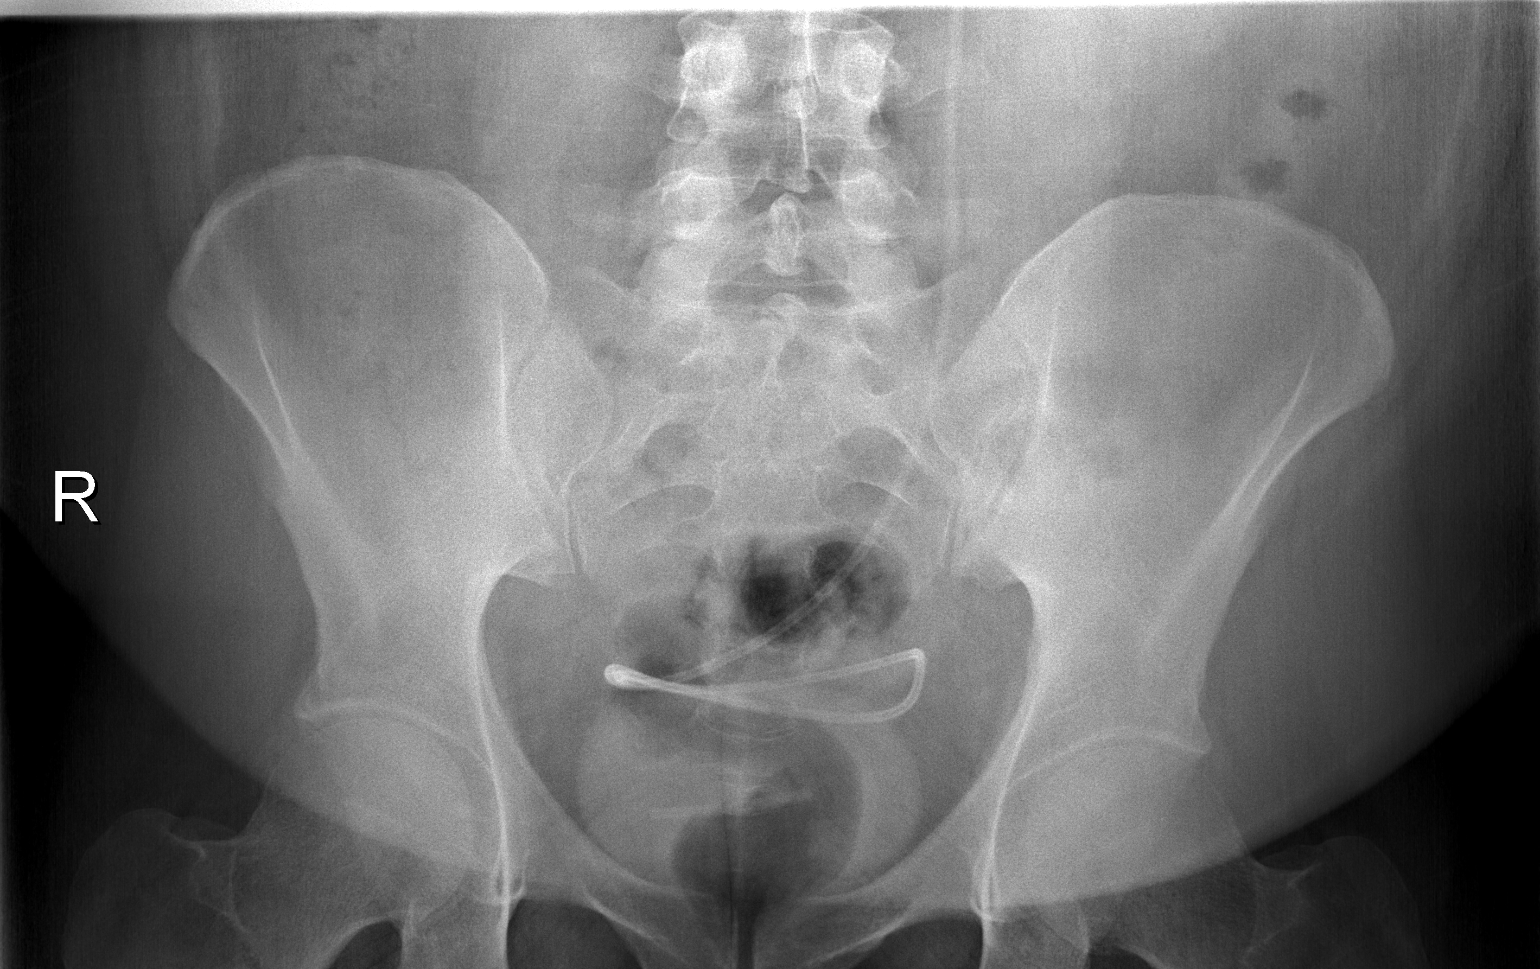

[2 of 2 positions shown; findings below may reference images not displayed]

FINDINGS: The intracranial portion of the shunt appears to be in good
position. I do not see any complicating features. There are
discontinuous areas but that is typical. Dural calcifications are
noted.

There is an old disconnected catheter in the right neck area a some
surrounding dystrophic calcification. The new catheter appears
patent into the chest.

The catheter is patent coursing down the chest into the abdomen. No
complicating features are identified. The lungs are clear. The
abdominal bowel gas pattern is unremarkable. The bony structures are
unremarkable.
IMPRESSION: No complicating features associated with the ventriculoperitoneal
shunt catheter.

## 2023-01-24 ENCOUNTER — Ambulatory Visit (INDEPENDENT_AMBULATORY_CARE_PROVIDER_SITE_OTHER): Payer: Medicaid Other

## 2023-01-24 ENCOUNTER — Ambulatory Visit (HOSPITAL_COMMUNITY)
Admission: EM | Admit: 2023-01-24 | Discharge: 2023-01-24 | Disposition: A | Discharge status: Home / Self Care | Payer: Medicaid Other | Attending: Family Medicine | Admitting: Family Medicine

## 2023-01-24 ENCOUNTER — Encounter (HOSPITAL_COMMUNITY): Payer: Self-pay | Admitting: Emergency Medicine

## 2023-01-24 DIAGNOSIS — R509 Fever, unspecified: Secondary | ICD-10-CM | POA: Diagnosis not present

## 2023-01-24 DIAGNOSIS — J069 Acute upper respiratory infection, unspecified: Secondary | ICD-10-CM | POA: Insufficient documentation

## 2023-01-24 DIAGNOSIS — J029 Acute pharyngitis, unspecified: Secondary | ICD-10-CM | POA: Insufficient documentation

## 2023-01-24 DIAGNOSIS — R059 Cough, unspecified: Secondary | ICD-10-CM

## 2023-01-24 LAB — POC INFLUENZA A AND B ANTIGEN (URGENT CARE ONLY)
INFLUENZA A ANTIGEN, POC: NEGATIVE
INFLUENZA B ANTIGEN, POC: NEGATIVE

## 2023-01-24 LAB — POCT RAPID STREP A, ED / UC: Streptococcus, Group A Screen (Direct): NEGATIVE

## 2023-01-24 MED ORDER — LIDOCAINE VISCOUS HCL 2 % MT SOLN: 5.0000 mL | Freq: Four times a day (QID) | OROMUCOSAL | 0 refills | Status: DC | PRN

## 2023-01-24 NOTE — ED Triage Notes (Signed)
Pt had fever and cough. Had Robitussin for cough around 1130. Fever was 100 not given meds for that. Pt reports that hard to swallow due to pain.

## 2023-01-24 NOTE — Discharge Instructions (Signed)
Continue Tylenol and Advil as directed on the bottles for any fever and pain.

## 2023-01-24 NOTE — ED Provider Notes (Signed)
[place]   [id number] 01/24/23 Arrival Time: [id number]  ASSESSMENT & PLAN:  1. Viral URI with cough   2. Sore throat   3. Fever, unspecified fever cause     Discussed typical duration of likely viral illness. Results for orders placed or performed during the hospital encounter of 01/24/23  POCT Rapid Strep A  Result Value Ref Range   Streptococcus, Group A Screen (Direct) NEGATIVE NEGATIVE  POC Influenza A & B Ag (Urgent Care)  Result Value Ref Range   INFLUENZA A ANTIGEN, POC NEGATIVE NEGATIVE   INFLUENZA B ANTIGEN, POC NEGATIVE NEGATIVE   Throat culture pending. OTC symptom care as needed.  Meds ordered this encounter  Medications   magic mouthwash (lidocaine, diphenhydrAMINE, alum & mag hydroxide) suspension    Sig: Swish and spit 5 mLs 4 (four) times daily as needed for mouth pain.    Dispense:  360 mL    Refill:  0   CXR to r/o PNA.  I have personally viewed the imaging studies ordered this visit. No PNA seen.    Discharge Instructions      Continue Tylenol and Advil as directed on the bottles for any fever and pain.      Follow-up Information     [facility]   Specialty: Family Medicine Why: As needed. Contact information: [address] [place] [id number] [phone]                 Reviewed expectations re: course of current medical issues. Questions answered. Outlined signs and symptoms indicating need for more acute intervention. Understanding verbalized. After Visit Summary given.   SUBJECTIVE: History from: Patient. Eric Wyatt is a 32 y.o. male. Reports: fever and cough; sev days to one week. Had Robitussin for cough around 1130; mild help. Fever was 100 not given meds for that. Pt reports that hard to swallow due to pain. Worried about PNA. Requests CXR. Tolerating PO fluids without n/v/d.  OBJECTIVE:  Vitals:   01/24/23 1315  BP: 119/87  Pulse: (!) 114   Resp: 20  Temp: (!) 100.4 F (38 C)  TempSrc: Oral  SpO2: 91%    General appearance: alert; no distress Eyes: PERRLA; EOMI; conjunctiva normal HENT: [place]; AT; with mild nasal congestion; throat with erythema/cobblestoning Neck: supple  Lungs: speaks full sentences without difficulty; unlabored; dry cough Extremities: no edema Skin: warm and dry Neurologic: normal gait Psychological: alert and cooperative; normal mood and affect  Labs: Results for orders placed or performed during the hospital encounter of 01/24/23  POCT Rapid Strep A  Result Value Ref Range   Streptococcus, Group A Screen (Direct) NEGATIVE NEGATIVE  POC Influenza A & B Ag (Urgent Care)  Result Value Ref Range   INFLUENZA A ANTIGEN, POC NEGATIVE NEGATIVE   INFLUENZA B ANTIGEN, POC NEGATIVE NEGATIVE   Labs Reviewed  CULTURE, GROUP A STREP [facility])  POCT RAPID STREP A, ED / UC  POC INFLUENZA A AND B ANTIGEN (URGENT CARE ONLY)    Imaging: DG Chest 2 View  Result Date: 01/24/2023 CLINICAL DATA:  Fever, cough and sore throat. EXAM: CHEST - 2 VIEW COMPARISON:  02/22/2021 FINDINGS: The cardiac silhouette, mediastinal and hilar contours are within normal limits given the low lung glands. Mild basilar vascular crowding and streaky atelectasis due to low lung volumes but no infiltrates, edema effusions. The bony thorax is intact. Right-sided VP shunt catheters noted. IMPRESSION: Low lung volumes with vascular crowding and streaky  atelectasis. Electronically Signed   By: Marijo Sanes M.D.   On: 01/24/2023 14:38    No Known Allergies  Past Medical History:  Diagnosis Date   Seizures ([place])    Social History   Socioeconomic History   Marital status: Single    Spouse name: Not on file   Number of children: Not on file   Years of education: Not on file   Highest education level: Not on file  Occupational History   Not on file  Tobacco Use   Smoking status: Passive Smoke Exposure - Never Smoker   Smokeless  tobacco: Never  Substance and Sexual Activity   Alcohol use: No   Drug use: No   Sexual activity: Never  Other Topics Concern   Not on file  Social History Narrative   [name] is attends [employer]; he does well. He lives with his [facility] family Mrs. Lavina Hamman. He enjoys dinning out, video games, basketball, and dancing.    Social Determinants of Health   Financial Resource Strain: Not on file  Food Insecurity: Not on file  Transportation Needs: Not on file  Physical Activity: Not on file  Stress: Not on file  Social Connections: Not on file  Intimate Partner Violence: Not on file   No family history on file. Past Surgical History:  Procedure Laterality Date   SHUNT REVISION  06/17/2008   VENTRICULO-PERITONEAL SHUNT PLACEMENT / LAPAROSCOPIC INSERTION PERITONEAL CATHETER       Vanessa Kick, MD 01/24/23 1454

## 2023-01-27 LAB — CULTURE, GROUP A STREP (THRC)

## 2023-09-25 ENCOUNTER — Emergency Department (HOSPITAL_BASED_OUTPATIENT_CLINIC_OR_DEPARTMENT_OTHER): Payer: MEDICAID

## 2023-09-25 ENCOUNTER — Other Ambulatory Visit: Payer: Self-pay

## 2023-09-25 ENCOUNTER — Encounter (HOSPITAL_BASED_OUTPATIENT_CLINIC_OR_DEPARTMENT_OTHER): Payer: Self-pay | Admitting: Emergency Medicine

## 2023-09-25 ENCOUNTER — Emergency Department (HOSPITAL_BASED_OUTPATIENT_CLINIC_OR_DEPARTMENT_OTHER)
Admission: EM | Admit: 2023-09-25 | Discharge: 2023-09-25 | Disposition: A | Discharge status: Home / Self Care | Payer: MEDICAID | Attending: Emergency Medicine | Admitting: Emergency Medicine

## 2023-09-25 DIAGNOSIS — Z1152 Encounter for screening for COVID-19: Secondary | ICD-10-CM | POA: Diagnosis not present

## 2023-09-25 DIAGNOSIS — R531 Weakness: Secondary | ICD-10-CM | POA: Insufficient documentation

## 2023-09-25 LAB — URINALYSIS, ROUTINE W REFLEX MICROSCOPIC
Bacteria, UA: NONE SEEN
Bilirubin Urine: NEGATIVE
Glucose, UA: NEGATIVE mg/dL
Hgb urine dipstick: NEGATIVE
Ketones, ur: NEGATIVE mg/dL
Leukocytes,Ua: NEGATIVE
Nitrite: NEGATIVE
Protein, ur: 30 mg/dL — AB
Specific Gravity, Urine: 1.019 (ref 1.005–1.030)
pH: 8.5 — ABNORMAL HIGH (ref 5.0–8.0)

## 2023-09-25 LAB — CBC
HCT: 42 % (ref 39.0–52.0)
Hemoglobin: 14.2 g/dL (ref 13.0–17.0)
MCH: 31.3 pg (ref 26.0–34.0)
MCHC: 33.8 g/dL (ref 30.0–36.0)
MCV: 92.5 fL (ref 80.0–100.0)
Platelets: 156 10*3/uL (ref 150–400)
RBC: 4.54 MIL/uL (ref 4.22–5.81)
RDW: 13.2 % (ref 11.5–15.5)
WBC: 8.1 10*3/uL (ref 4.0–10.5)
nRBC: 0 % (ref 0.0–0.2)

## 2023-09-25 LAB — CBG MONITORING, ED: Glucose-Capillary: 105 mg/dL — ABNORMAL HIGH (ref 70–99)

## 2023-09-25 LAB — RESP PANEL BY RT-PCR (RSV, FLU A&B, COVID)  RVPGX2
Influenza A by PCR: NEGATIVE
Influenza B by PCR: NEGATIVE
Resp Syncytial Virus by PCR: NEGATIVE
SARS Coronavirus 2 by RT PCR: NEGATIVE

## 2023-09-25 LAB — BASIC METABOLIC PANEL
Anion gap: 6 (ref 5–15)
BUN: 10 mg/dL (ref 6–20)
CO2: 32 mmol/L (ref 22–32)
Calcium: 9.7 mg/dL (ref 8.9–10.3)
Chloride: 104 mmol/L (ref 98–111)
Creatinine, Ser: 0.7 mg/dL (ref 0.61–1.24)
GFR, Estimated: >60 mL/min (ref >60)
Glucose, Bld: 117 mg/dL — ABNORMAL HIGH (ref 70–99)
Potassium: 3.8 mmol/L (ref 3.5–5.1)
Sodium: 142 mmol/L (ref 135–145)

## 2023-09-25 LAB — TROPONIN I (HIGH SENSITIVITY): Troponin I (High Sensitivity): 4 ng/L (ref <18)

## 2023-09-25 MED ORDER — ACETAMINOPHEN 325 MG PO TABS
650.0000 mg | ORAL_TABLET | Freq: Once | ORAL | Status: DC
  Filled 2023-09-25: qty 2

## 2023-09-25 NOTE — ED Provider Notes (Signed)
[facility] Provider Note   CSN: [id number] Arrival date & time: 09/25/23  4696     History  Chief Complaint  Patient presents with   Weakness    Eric Wyatt is a 32 y.o. male.   Weakness   32 year old male presents emergency department with caregiver with complaints of generalized weakness.  Caregiver states that patient was at his baseline this morning eating and behaving at baseline around 845 began to show signs of generalized weakness.  Caregiver states that patient is usually talkative interacting but has not been talking without having conversation initiated with him.  Caregiver states that he has not been coughing, complaining of pain anywhere, fever, vomiting or other abnormality.  Patient denies any chest pain, shortness of breath, abdominal pain, nausea vomit, urinary symptoms, change in bowel habits.  Denies any known rash.  Patient states that he is just not feeling well.  Past medical history significant for mild intellectual disability, intermittent explosive disorder, seizure, congenital hydrocephalus with VP shunt  Home Medications Prior to Admission medications   Medication Sig Start Date End Date Taking? Authorizing Provider  acetaminophen (TYLENOL) 325 MG suppository Place 325 mg rectally every 4 (four) hours as needed for fever or mild pain.    [provider]  clonazePAM (KLONOPIN) 0.5 MG tablet Take 1 mg by mouth every evening. 01/27/21   [provider]  divalproex (DEPAKOTE ER) 250 MG 24 hr tablet Take 2 tablets twice per day Patient taking differently: Take 750 mg by mouth 2 (two) times daily. 11/17/20   Elveria Rising, NP  docusate sodium (COLACE) 100 MG capsule Take 100 mg by mouth 2 (two) times daily.    [provider]  magic mouthwash (lidocaine, diphenhydrAMINE, alum & mag hydroxide) suspension Swish and spit 5 mLs 4 (four) times daily as needed for mouth pain. 01/24/23   Mardella Layman, MD  propranolol (INDERAL) 10 MG tablet Take 1 tablet in the morning and take 1 tablet at night Patient taking differently: Take 10 mg by mouth 2 (two) times daily. 03/03/21   Elveria Rising, NP  risperiDONE (RISPERDAL) 3 MG tablet Take 3 mg by mouth 2 (two) times daily.    [provider]      Allergies    Patient has no known allergies.    Review of Systems   Review of Systems  Neurological:  Positive for weakness.  All other systems reviewed and are negative.   Physical Exam Updated Vital Signs BP 118/83 (BP Location: Right Arm)   Pulse (!) 108   Temp 98.6 F (37 C) (Oral)   Resp 17   Wt 100.2 kg   SpO2 96%   BMI 31.68 kg/m  Physical Exam Vitals and nursing note reviewed.  Constitutional:      General: He is not in acute distress.    Appearance: He is well-developed.     Comments: Patient with flat affect.  Able to be aroused and respond appropriately to questions.  Answers yes or no to questions appropriately.  Alert to time, place and event.  HENT:     Head: Normocephalic and atraumatic.  Eyes:     Conjunctiva/sclera: Conjunctivae normal.  Cardiovascular:     Rate and Rhythm: Normal rate and regular rhythm.     Heart sounds: No murmur heard. Pulmonary:     Effort: Pulmonary effort is normal. No respiratory distress.     Breath sounds: No wheezing, rhonchi or rales.  Abdominal:  Palpations: Abdomen is soft.     Tenderness: There is no abdominal tenderness. There is no guarding.  Musculoskeletal:        General: No swelling.     Cervical back: Neck supple.     Right lower leg: No edema.     Left lower leg: No edema.  Skin:    General: Skin is warm and dry.     Capillary Refill: Capillary refill takes less than 2 seconds.  Neurological:     Mental Status: He is alert.     Comments: Alert and oriented to self, place, event.   Speech is fluent, clear without dysarthria or dysphasia.   Strength 5/5 in upper/lower extremities   Sensation  intact in upper/lower extremities   Normal gait.  CN I not tested  CN II not tested CN III, IV, VI PERRLA and EOMs intact bilaterally.  Patient with left-sided exotropia which is baseline per caregiver CN V Intact sensation to sharp and light touch to the face  CN VII facial movements symmetric  CN VIII not tested  CN IX, X no uvula deviation, symmetric rise of soft palate  CN XI 5/5 SCM and trapezius strength bilaterally  CN XII Midline tongue protrusion, symmetric L/R movements     Psychiatric:        Mood and Affect: Mood normal.     ED Results / Procedures / Treatments   Labs (all labs ordered are listed, but only abnormal results are displayed) Labs Reviewed  BASIC METABOLIC PANEL - Abnormal; Notable for the following components:      Result Value   Glucose, Bld 117 (*)    All other components within normal limits  URINALYSIS, ROUTINE W REFLEX MICROSCOPIC - Abnormal; Notable for the following components:   pH 8.5 (*)    Protein, ur 30 (*)    All other components within normal limits  CBG MONITORING, ED - Abnormal; Notable for the following components:   Glucose-Capillary 105 (*)    All other components within normal limits  RESP PANEL BY RT-PCR (RSV, FLU A&B, COVID)  RVPGX2  CBC  TROPONIN I (HIGH SENSITIVITY)    EKG EKG Interpretation Date/Time:  Monday September 25 2023 10:09:42 EDT Ventricular Rate:  79 PR Interval:  141 QRS Duration:  129 QT Interval:  387 QTC Calculation: 444 R Axis:   -40  Text Interpretation: Sinus rhythm RBBB and LAFB LVH by voltage No old tracing to compare Confirmed by Melene Plan [phone]) on 09/25/2023 10:16:31 AM  Radiology DG Abd 1 View  Result Date: 09/25/2023 CLINICAL DATA:  Chest pain. Evaluate for shunt malfunction. Generalized weakness. EXAM: CHEST  1 VIEW; ABDOMEN - 1 VIEW COMPARISON:  01/24/2023 FINDINGS: Chest: Cardiomediastinal silhouette and pulmonary vasculature normal in appearance. Lungs are clear. Abdomen: Nonspecific  air-filled nondilated loops of small bowel seen in the left abdomen. No dilated loops of bowel to indicate ileus or obstruction. No discontinuity of the VP shunt catheter within the chest or abdomen. IMPRESSION: 1. No acute cardiopulmonary process. 2. Nonspecific nonobstructive bowel gas pattern. 3. No abnormality of the visualized VP shunt catheter within the chest or abdomen. Electronically Signed   By: Acquanetta Belling M.D.   On: 09/25/2023 13:41   DG Chest 1 View  Result Date: 09/25/2023 CLINICAL DATA:  Chest pain. Evaluate for shunt malfunction. Generalized weakness. EXAM: CHEST  1 VIEW; ABDOMEN - 1 VIEW COMPARISON:  01/24/2023 FINDINGS: Chest: Cardiomediastinal silhouette and pulmonary vasculature normal in appearance. Lungs are clear. Abdomen: Nonspecific  air-filled nondilated loops of small bowel seen in the left abdomen. No dilated loops of bowel to indicate ileus or obstruction. No discontinuity of the VP shunt catheter within the chest or abdomen. IMPRESSION: 1. No acute cardiopulmonary process. 2. Nonspecific nonobstructive bowel gas pattern. 3. No abnormality of the visualized VP shunt catheter within the chest or abdomen. Electronically Signed   By: Acquanetta Belling M.D.   On: 09/25/2023 13:41   CT Head Wo Contrast  Result Date: 09/25/2023 CLINICAL DATA:  shunt; Neck trauma, abnormal mental status or neuro exam (Ped 3-15y) EXAM: CT HEAD WITHOUT CONTRAST CT CERVICAL SPINE WITHOUT CONTRAST TECHNIQUE: Multidetector CT imaging of the head and cervical spine was performed following the standard protocol without intravenous contrast. Multiplanar CT image reconstructions of the cervical spine were also generated. RADIATION DOSE REDUCTION: This exam was performed according to the departmental dose-optimization program which includes automated exposure control, adjustment of the mA and/or kV according to patient size and/or use of iterative reconstruction technique. COMPARISON:  CT Head and C Spine 09/25/23  FINDINGS: CT HEAD FINDINGS Limitations: Motion degraded exam Brain: Right parietal approach ventriculostomy catheter in place with tip near the atrium of the right lateral ventricle. Ventricular configuration suggestive of callosal dysgenesis. Size and shape of the ventricular system is unchanged from prior exam without evidence of hydrocephalus. No hemorrhage. No extra-axial fluid collection. No CT evidence of an acute cortical infarct. No mass effect. No mass lesion Vascular: No hyperdense vessel or unexpected calcification. Skull: Normal. Negative for fracture or focal lesion. Sinuses/Orbits: No middle ear or mastoid effusion. Paranasal sinuses are clear. Orbits are unremarkable. Other: None CT CERVICAL SPINE FINDINGS Alignment: Normal. Skull base and vertebrae: No acute fracture. No primary bone lesion or focal pathologic process. Soft tissues and spinal canal: No evidence of high-grade spinal canal stenosis. Disc levels:  No evidence of high-grade spinal canal stenosis Upper chest: Negative Other: Shunt catheter tubing is seen along the right lateral neck. IMPRESSION: 1. No acute intracranial abnormality. 2. Right parietal approach ventriculostomy catheter in place with tip near the atrium of the right lateral ventricle. Hydrocephalus. The radiopaque portions of the shunt catheter tubing are intact. 3. No acute fracture or traumatic malalignment of the cervical spine. Electronically Signed   By: Lorenza Cambridge M.D.   On: 09/25/2023 13:00   CT Cervical Spine Wo Contrast  Result Date: 09/25/2023 CLINICAL DATA:  shunt; Neck trauma, abnormal mental status or neuro exam (Ped 3-15y) EXAM: CT HEAD WITHOUT CONTRAST CT CERVICAL SPINE WITHOUT CONTRAST TECHNIQUE: Multidetector CT imaging of the head and cervical spine was performed following the standard protocol without intravenous contrast. Multiplanar CT image reconstructions of the cervical spine were also generated. RADIATION DOSE REDUCTION: This exam was  performed according to the departmental dose-optimization program which includes automated exposure control, adjustment of the mA and/or kV according to patient size and/or use of iterative reconstruction technique. COMPARISON:  CT Head and C Spine 09/25/23 FINDINGS: CT HEAD FINDINGS Limitations: Motion degraded exam Brain: Right parietal approach ventriculostomy catheter in place with tip near the atrium of the right lateral ventricle. Ventricular configuration suggestive of callosal dysgenesis. Size and shape of the ventricular system is unchanged from prior exam without evidence of hydrocephalus. No hemorrhage. No extra-axial fluid collection. No CT evidence of an acute cortical infarct. No mass effect. No mass lesion Vascular: No hyperdense vessel or unexpected calcification. Skull: Normal. Negative for fracture or focal lesion. Sinuses/Orbits: No middle ear or mastoid effusion. Paranasal sinuses are clear. Orbits are  unremarkable. Other: None CT CERVICAL SPINE FINDINGS Alignment: Normal. Skull base and vertebrae: No acute fracture. No primary bone lesion or focal pathologic process. Soft tissues and spinal canal: No evidence of high-grade spinal canal stenosis. Disc levels:  No evidence of high-grade spinal canal stenosis Upper chest: Negative Other: Shunt catheter tubing is seen along the right lateral neck. IMPRESSION: 1. No acute intracranial abnormality. 2. Right parietal approach ventriculostomy catheter in place with tip near the atrium of the right lateral ventricle. Hydrocephalus. The radiopaque portions of the shunt catheter tubing are intact. 3. No acute fracture or traumatic malalignment of the cervical spine. Electronically Signed   By: Lorenza Cambridge M.D.   On: 09/25/2023 13:00    Procedures Procedures    Medications Ordered in ED Medications  acetaminophen (TYLENOL) tablet 650 mg (650 mg Oral Not Given 09/25/23 1050)    ED Course/ Medical Decision Making/ A&P Clinical Course as of  09/25/23 1605  Mon Sep 25, 2023  1425 On reassessment, patient able to string together multiple sentences in a row.  Caregiver states that this is returning to baseline.  Conversation with caregiver outside the room who is with patient every single day states that he is more convinced that the patient is "playing him."  Patient is reportedly been speaking with caregiver in more detail sentences.  Upon further review, patient states that he tripped on a curb and caught self with his hands prior to feeling bad.  Still declining any pain.  No reproducible tenderness on exam or signs of trauma.  Patient declined any seizure-like activity and states he has been aware of his surroundings and what is been going on since waking up this morning.  Caregiver thinks that there is a behavioral component to his behavior currently. [CR]  1453 Reevaluation again showed patient tolerating p.o., sitting up in bed interacting at baseline.  Patient able to ambulate without assistance.  Will discharge and recommend appropriate follow-up [CR]    Clinical Course User Index [CR] Peter Garter, PA                                 Medical Decision Making Amount and/or Complexity of Data Reviewed Labs: ordered. Radiology: ordered.  Risk OTC drugs.   This patient presents to the ED for concern of generalized weakness, this involves an extensive number of treatment options, and is a complaint that carries with it a high risk of complications and morbidity.  The differential diagnosis includes sepsis, UTI, viral URI, dehydration, CVA, electrolyte imbalance, pneumonia, other   Co morbidities that complicate the patient evaluation  See HPI   Additional history obtained:  Additional history obtained from EMR External records from outside source obtained and reviewed including hospital records   Lab Tests:  I Ordered, and personally interpreted labs.  The pertinent results include: No leukocytosis.  No evidence  of anemia.  Platelets within range.  No electrolyte abnormalities.  No renal dysfunction.  Respiratory viral panel negative.  UA significant for 30 proteins but otherwise unremarkable.  CBG of 105.  Troponin of 4   Imaging Studies ordered:  I ordered imaging studies including CT head/cervical spine, chest, abdominal x-ray I independently visualized and interpreted imaging which showed  CT head/cervical spine: No acute abnormality.  Right parietal approach ventriculostomy catheter in place near atrium of right lateral ventricle.  Hydrocephalus.  No acute fracture or traumatic malalignment of cervical spine Chest/abdominal x-ray no  acute cardiopulmonary abnormality.  Nonspecific nonobstructive bowel gas pattern.  No abnormality visualized in VP shunt catheter in chest or abdomen. I agree with the radiologist interpretation  Cardiac Monitoring: / EKG:  The patient was maintained on a cardiac monitor.  I personally viewed and interpreted the cardiac monitored which showed an underlying rhythm of: Sinus rhythm   Consultations Obtained:  I requested consultation with attending physician Dr. Adela Lank who is agreement with treatment plan going forward   Problem List / ED Course / Critical interventions / Medication management  Generalized weakness I ordered medication including Tylenol   Reevaluation of the patient after these medicines showed that the patient improved I have reviewed the patients home medicines and have made adjustments as needed   Social Determinants of Health:  Secondhand smoke exposure.  Denies any tobacco use or illicit drug use   Test / Admission - Considered:  Generalized weakness Vitals signs within normal range and stable throughout visit. Laboratory/imaging studies significant for: See above 32 year old male presents emergency department accompanied by caregiver with complaints of generalized weakness.  Symptoms began around 8-8 30 this morning and patient was  at baseline before.  Initially on exam, patient able to be aroused and answers appropriately to questions and is aware of place as well as event and he is.  Given concern for alterations from his baseline, workup was pursued.  Patient with known VP shunt as well as history of seizures.  On exam, no evidence of tongue biting or bowel/bladder incontinence.  Caregiver states that he has well wound care 24/7 and without any witnessed seizure; patient declines any seizure activity.  Shunt series unremarkable for any acute abnormality.  Labs without any concerning abnormality.  Serial examinations as well as continued observation and caregiver showed patient talking in complex sentences and then would go back to his initial presentation of only saying a word or 2 in response to questions.  Caregiver concerned that he may be "faking it."  After results are being discussed with patient and caregiver, patient was subsequently given p.o. challenge and sat up in bed and ate graham crackers and drink water without difficulty.  Suspect there may be psychogenic etiology to patient's presentation.  No obvious infectious source as cause of generalized weakness.  No significant electrolyte derangement.  Patient does not have access to illicit substances living facility per caregivers so lower suspicion for drug side effect.  Lower suspicion for postictal phase given no reported seizure by patient, caregivers or evidence of current postictal phase.  Regardless, will recommend follow-up with neurology in the outpatient setting for further assessment as well as primary care.  Treatment plan discussed at length with patient and caregiver and they acknowledge understanding were agreeable to said plan.  Patient overall well-appearing, afebrile in no acute distress, tolerating p.o. without difficulty. Worrisome signs and symptoms were discussed with the patient, and the patient acknowledged understanding to return to the ED if noticed.  Patient was stable upon discharge.          Final Clinical Impression(s) / ED Diagnoses Final diagnoses:  Generalized weakness    Rx / DC Orders ED Discharge Orders     None         Peter Garter, PA 09/25/23 1605    Melene Plan, DO 09/26/23 [phone]

## 2023-09-25 NOTE — ED Notes (Signed)
Pt aware of the need for a urine... Unable to currently urinate.

## 2023-09-25 NOTE — ED Notes (Signed)
Discharge paperwork given and verbally understood by caregiver.Marland KitchenMarland Kitchen

## 2023-09-25 NOTE — Discharge Instructions (Addendum)
As discussed, workup today overall reassuring.  Labs look good.  Imaging of the head, neck, chest, abdomen appeared normal.  Recommend continue monitoring your symptoms at home and you may return if you notice any of the worrisome signs or symptoms we discussed.  Otherwise, recommend follow-up with neurology as well as primary care in the outpatient setting.

## 2023-09-25 NOTE — ED Triage Notes (Signed)
Pt bib caregiver, endorses weakness today. Pt denies pain. Pt reports generalized weakness

## 2024-04-11 ENCOUNTER — Telehealth (HOSPITAL_COMMUNITY): Payer: Self-pay

## 2024-04-11 NOTE — Telephone Encounter (Signed)
 Informed by front desk staff: "Pt caretaker called asking if rx could be removed. Caretaker Royston Cornea contact number [phone]."  Called and spoke with Patient caregiver. Patient was seen 01/28/23 and prescribed magic mouthwash. Patient currently lives in a facility and they are needing a written order to discontinue this medication as it is expired and the Patient no longer on this. Caregiver states they have tried to have the PCP reconcile the medication list but was informed they could not discontinue this as they did not prescribe it.   Spoke with MD in staffing today and was told they can sign an order stating okay to discontinue as this was a one time treatment medication sent in by this urgent care a year ago.   Form written up and faxed to [phone] [facility].   Caregiver informed and verbalized understanding.
